# Patient Record
Sex: Male | Born: 1975 | Hispanic: No | Marital: Single
Health system: Southern US, Community
[De-identification: ages and names within clinical notes are randomized; demographics above are authoritative.]

## PROBLEM LIST (undated history)

## (undated) DIAGNOSIS — E785 Hyperlipidemia, unspecified: Secondary | ICD-10-CM

## (undated) DIAGNOSIS — E781 Pure hyperglyceridemia: Secondary | ICD-10-CM

## (undated) DIAGNOSIS — F17200 Nicotine dependence, unspecified, uncomplicated: Secondary | ICD-10-CM

## (undated) DIAGNOSIS — K8020 Calculus of gallbladder without cholecystitis without obstruction: Secondary | ICD-10-CM

## (undated) HISTORY — DX: Calculus of gallbladder without cholecystitis without obstruction: K80.20

## (undated) HISTORY — PX: SHOULDER SURGERY: SHX246

## (undated) HISTORY — DX: Nicotine dependence, unspecified, uncomplicated: F17.200

## (undated) HISTORY — DX: Pure hyperglyceridemia: E78.1

## (undated) HISTORY — PX: NOSE SURGERY: SHX723

## (undated) HISTORY — DX: Hyperlipidemia, unspecified: E78.5

---

## 2007-12-14 ENCOUNTER — Ambulatory Visit (HOSPITAL_BASED_OUTPATIENT_CLINIC_OR_DEPARTMENT_OTHER): Admission: RE | Admit: 2007-12-14 | Discharge: 2007-12-14 | Payer: Self-pay | Admitting: Otolaryngology

## 2007-12-19 ENCOUNTER — Ambulatory Visit: Payer: Self-pay | Admitting: Internal Medicine

## 2008-03-08 ENCOUNTER — Ambulatory Visit (HOSPITAL_BASED_OUTPATIENT_CLINIC_OR_DEPARTMENT_OTHER): Admission: RE | Admit: 2008-03-08 | Discharge: 2008-03-08 | Payer: Self-pay | Admitting: Orthopedic Surgery

## 2010-06-18 LAB — POCT HEMOGLOBIN-HEMACUE: Hemoglobin: 14 g/dL (ref 13.0–17.0)

## 2010-07-17 NOTE — Procedures (Signed)
Edwin Mcdowell, Edwin Mcdowell                ACCOUNT NO.:  [id number]   MEDICAL RECORD NO.:  [id number]          PATIENT TYPE:  OUT   LOCATION:  SLEEP CENTER                 FACILITY:  [facility]   PHYSICIAN:  Clinton D. Maple Hudson, MD, FCCP, FACPDATE OF BIRTH:  11-10-75   DATE OF STUDY:  12/14/2007                            NOCTURNAL POLYSOMNOGRAM   REFERRING PHYSICIAN:  Suzanna Obey, M.D.   REFERRING PHYSICIAN:  Suzanna Obey, M.D.   INDICATION FOR STUDY:  Hypersomnia with sleep apnea.   EPWORTH SLEEPINESS SCORE:  Epworth sleepiness score 6/24.  BMI 25.  Weight 190 pounds.  Height 73 inches.  Neck 15.5 inches.   MEDICATIONS:  Home Medication:  None listed.   SLEEP ARCHITECTURE:  Split study protocol.  During the diagnostic phase,  total sleep time was 120 minutes with sleep efficiency 93.8%.  Stage I  was 3.3%.  Stage II 72.1%.  Stage III 11.3%.  REM is 13.3% of total  sleep time.  Sleep latency 7 minutes.  REM latency 78.5 minutes.  Wake  after sleep onset of 1 minute.  Arousal index 25.5.  No bedtime  medications were taken.   RESPIRATORY DATA:  Split study protocol.  Apnea-hypopnea index (AHI)  18.5 per hour before CPAP.  37 events were scored including 6  obstructive apneas, 4 central apneas, 1 mixed apnea, and 26 hypopneas.  Events were more common while in supine.  REM AHI 37.5.  CPAP was  titrated to 9 CWP, AHI 3.7 per hour.  It shows a medium [employer] SNAPP with heated humidifier.   OXYGEN DATA:  Mild-to-moderate snoring before CPAP with oxygen  desaturation to a nadir of 87%.  After CPAP control, mean oxygen  saturation held 95.8% on room air.   CARDIAC DATA:  Normal sinus rhythm.   MOVEMENT-PARASOMNIA:  Occasional limb jerk with no sleep disturbance  associated.  No bathroom trips.   IMPRESSIONS-RECOMMENDATIONS:  1. Moderate obstructive and central sleep apnea, AHI 18.5 per hour.      Most events were while supine but not exclusively.  Mild-to-      moderate snoring  with oxygen desaturation to a nadir of 87%.  The      few central apneas noted are unlikely to be of medical      significance.  2. Successful CPAP titration to AHI 9.2 per hour.  He may tolerate      another pressure step up to 10 CWP to compensate for breakthrough      events.  It shows a [employer].      Clinton D. Maple Hudson, MD, [facility], FACP  Diplomate, Biomedical engineer of Sleep Medicine  Electronically Signed     CDY/MEDQ  D:  12/19/2007 15:57:52  T:  12/20/2007 01:34:39  Job:  [id number]

## 2010-07-17 NOTE — Op Note (Signed)
Edwin Mcdowell, Edwin Mcdowell                ACCOUNT NO.:  [id number]   MEDICAL RECORD NO.:  [id number]          PATIENT TYPE:  AMB   LOCATION:  [facility]                         FACILITY:  [facility]   PHYSICIAN:  Marlowe Kays, M.D.  DATE OF BIRTH:  1976-01-24   DATE OF PROCEDURE:  03/08/2008  DATE OF DISCHARGE:                               OPERATIVE REPORT   PREOPERATIVE DIAGNOSES:  1. Chronic impingement syndrome.  2. Labral tear, right shoulder.   POSTOPERATIVE DIAGNOSES:  1. Chronic impingement syndrome.  2. Labral tear, right shoulder.   OPERATION:  1. Right shoulder arthroscopy with debridement of labrum and some      fibrous bands in the glenohumeral joint.  2. Arthroscopic subacromial decompression.   SURGEON:  Marlowe Kays, M.D.   ASSISTANTDruscilla Brownie. Cherlynn June.   ANESTHESIA:  General.   PATHOLOGY AND INDICATION FOR PROCEDURE:  I had followed this man for an  extended period of time because of shoulder problems.  He has had two  MRIs with the most recent being on January 19, 2008 showing the labral  tear with a 5 mm para-labral cyst and the type III acromion with  features of the impingement.  Because of his chronic disabilities he is  here today for the above-mentioned surgery.   PROCEDURE:  After satisfactory anesthesia, beach-chair position on the  sliding frame, right shoulder girdle was prepped with DuraPrep and  draped in sterile field.  Anatomy of the shoulder joint was marked out.  A time-out performed.  The posterior and lateral portals and subacromial  space were infiltrated with 0.5% Marcaine with adrenaline and through a  posterior soft spot portal I atraumatically entered the glenohumeral  joint.  His biceps tendon and rotator cuff looked normal, as did his  humeral head and glenoid other than some mild degeneration of the labrum  both anteriorly and posteriorly and around the biceps anchor.  He also  had a fibrous band which covered the biceps tendon  went down to the  glenohumeral joint.  I advanced the scope between the biceps tendon and  subscapularis and using a switching stick and an anterior incision  placed a metal cannula into the shoulder joint, followed by 4.2 shaver.  I debrided down the labrum and also removed the fibrous band.  Final  pictures were taken.  I then redirected the scope in the subacromial  space through a lateral portal and introduced a 4.2 shaver.  He had a  significant amount of bursal tissue which I pictured and resected with  the 4.2 shaver.  He had also a significant impingement problem.  After  removing soft tissue from the undersurface of the acromion with the 9  degrees ArthroCare vaporizer, I brought in the 4-mm oval bur and began  burring down and decompressing the subacromial space.  I went back-and-  forth between the bur and the vaporizer until we had a wide  decompression.  I  documented with pictures of his arm to side and his  arm abducted at.  There was no bleeding at closure.  I removed all fluid  possible from subacromial space and infiltrated the three portals and  subacromial space with 0.5% Marcaine with  adrenaline.  The three portals were closed with 4-0 nylon.  Betadine,  Adaptic pressure dressing and shoulder immobilizer applied.  He  tolerated the procedure well.  At the time of this dictation he was on  his way to recovery in satisfactory condition with no known  complications.           ______________________________  Marlowe Kays, M.D.     JA/MEDQ  D:  03/08/2008  T:  03/08/2008  Job:  [id number]

## 2013-04-29 ENCOUNTER — Other Ambulatory Visit: Payer: Self-pay | Admitting: Family

## 2013-04-29 DIAGNOSIS — Q638 Other specified congenital malformations of kidney: Secondary | ICD-10-CM

## 2013-04-29 DIAGNOSIS — R109 Unspecified abdominal pain: Secondary | ICD-10-CM

## 2013-04-30 ENCOUNTER — Ambulatory Visit
Admission: RE | Admit: 2013-04-30 | Discharge: 2013-04-30 | Disposition: A | Discharge status: Home / Self Care | Payer: Commercial Managed Care - PPO | Source: Ambulatory Visit | Attending: Family | Admitting: Family

## 2013-04-30 DIAGNOSIS — R109 Unspecified abdominal pain: Secondary | ICD-10-CM

## 2013-04-30 DIAGNOSIS — Q638 Other specified congenital malformations of kidney: Secondary | ICD-10-CM

## 2013-05-05 ENCOUNTER — Telehealth (INDEPENDENT_AMBULATORY_CARE_PROVIDER_SITE_OTHER): Payer: Self-pay

## 2013-05-05 ENCOUNTER — Ambulatory Visit (INDEPENDENT_AMBULATORY_CARE_PROVIDER_SITE_OTHER): Payer: Commercial Managed Care - PPO | Admitting: General Surgery

## 2013-05-05 ENCOUNTER — Encounter (INDEPENDENT_AMBULATORY_CARE_PROVIDER_SITE_OTHER): Payer: Self-pay | Admitting: General Surgery

## 2013-05-05 VITALS — BP 136/84 | HR 71 | Temp 98.8°F | Resp 16 | Ht 74.0 in | Wt 222.5 lb

## 2013-05-05 DIAGNOSIS — M549 Dorsalgia, unspecified: Secondary | ICD-10-CM

## 2013-05-05 NOTE — Telephone Encounter (Signed)
Office notes for today's visit faxed to Dr. Debroah BallerFulp's office @ [phone], fax confirmation rec'd

## 2013-05-05 NOTE — Progress Notes (Signed)
Patient ID: Edwin Mcdowell, male   DOB: 07/03/1975, 38 y.o.   MRN: [id number]  No chief complaint on file.   HPI Edwin Mcdowell is a 38 y.o. male.  The patient is a 38 year old male who is referred by Dr. Jillyn HiddenFulp for evaluation of right lower back pain. Patient said this pain occurred this past weekend. He states a previous episode in the past. He states that he did not have any nausea or vomiting with the pain. Patient underwent a renal ultrasound which revealed sludge within the gallbladder however there was no stones. There is no signs of gallbladder wall thickening.  HPI  Past Medical History  Diagnosis Date  . Calculus of gallbladder without mention of cholecystitis or obstruction   . Hyperlipidemia   . Hypertriglyceridemia   . Tobacco use disorder     No past surgical history on file.  No family history on file.  Social History History  Substance Use Topics  . Smoking status: Not on file  . Smokeless tobacco: Not on file  . Alcohol Use: No    Allergies not on file  Current Outpatient Prescriptions  Medication Sig Dispense Refill  . tiZANidine (ZANAFLEX) 4 MG tablet       . traMADol (ULTRAM) 50 MG tablet        No current facility-administered medications for this visit.    Review of Systems Review of Systems  Constitutional: Negative.   HENT: Negative.   Eyes: Negative.   Respiratory: Negative.   Cardiovascular: Negative.   Gastrointestinal: Negative.   Endocrine: Negative.   Musculoskeletal: Positive for back pain.  Neurological: Negative.     Blood pressure 136/84, pulse 71, temperature 98.8 F (37.1 C), temperature source Temporal, resp. rate 16, height 6\' 2"  (1.88 m), weight 222 lb 8 oz (100.925 kg).  Physical Exam Physical Exam  Constitutional: He is oriented to person, place, and time. He appears well-developed and well-nourished.  HENT:  Head: Normocephalic and atraumatic.  Eyes: Conjunctivae and EOM are normal. Pupils are equal, round, and reactive to  light.  Neck: Normal range of motion. Neck supple.  Cardiovascular: Normal rate, regular rhythm and normal heart sounds.   Pulmonary/Chest: Effort normal and breath sounds normal.  Abdominal: Soft. Bowel sounds are normal. He exhibits no distension and no mass. There is no tenderness. There is no rebound and no guarding.  Musculoskeletal: Normal range of motion.  Neurological: He is alert and oriented to person, place, and time.  Skin: Skin is warm and dry.    Data Reviewed Ultrasound revealed sludge in the gallbladder  Assessment    38 year old male with right lower back pain likely not related to his gallbladder. I did discuss with the patient that should he began having right upper quadrant pain that is associated with right upper back pain this could potentially be his gallbladder. In fact the case would be happy to see him again and discuss surgery at that time. The patient was okay with this plan     Plan    1. Patient follow up as needed        Marigene EhlersRamirez Jr., Jed LimerickArmando 05/05/2013, 10:26 AM

## 2015-07-10 IMAGING — US US RENAL
1 series · 14 of 25 positions shown · non-contrast
Comparison: None.

CLINICAL DATA: Right flank pain.  Right abdominal pain.

EXAM:
RENAL/URINARY TRACT ULTRASOUND COMPLETE

[Series 1: us renal · 0.29mm/px · 46 acquisitions, 14 frames shown]
[im 1/46]
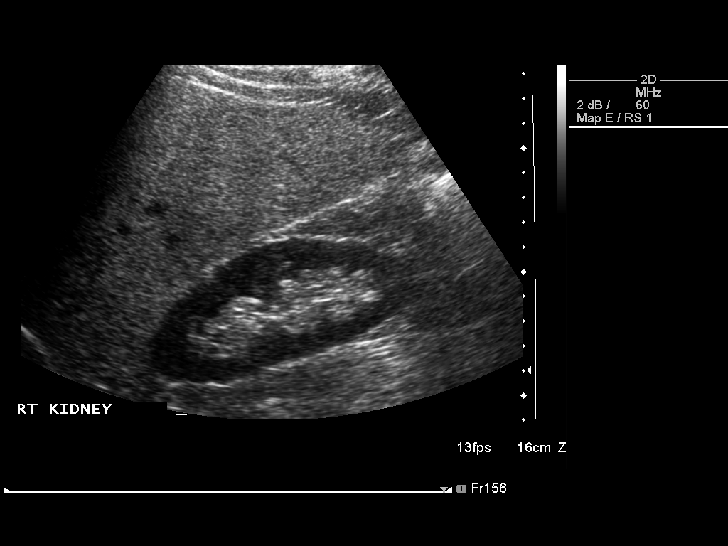
[im 4/46]
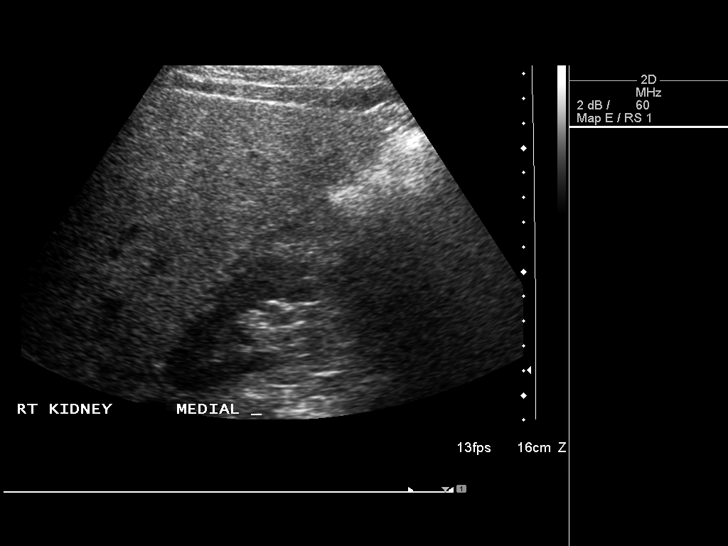
[im 8/46]
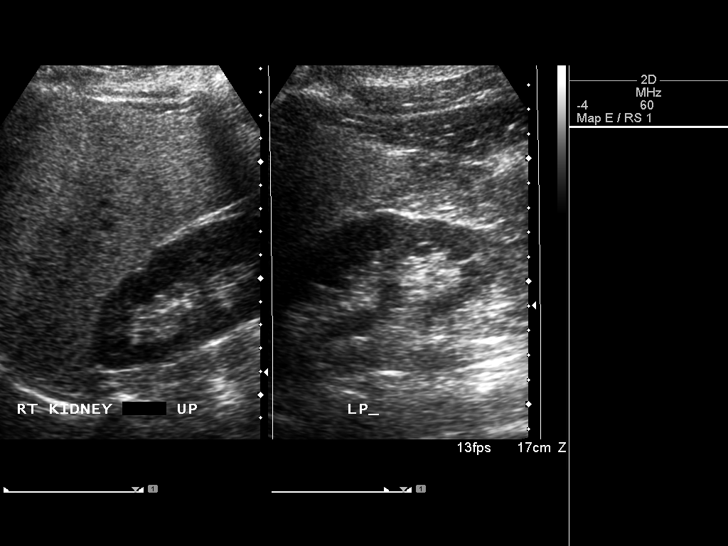
[im 12/46]
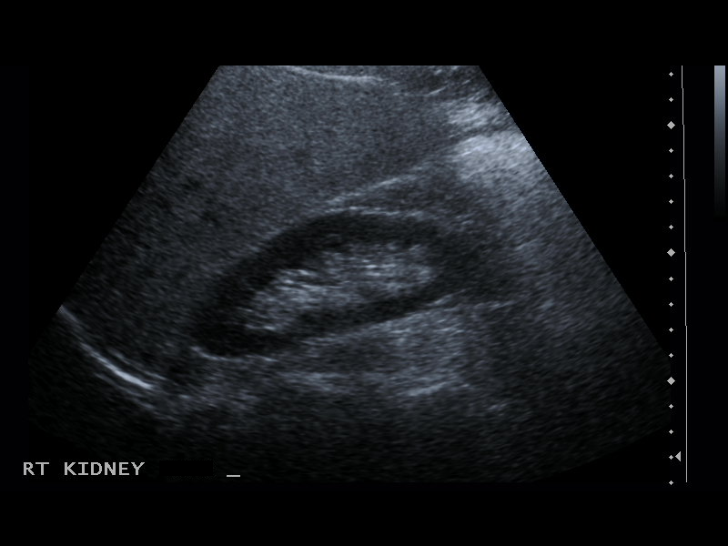
[im 16/46]
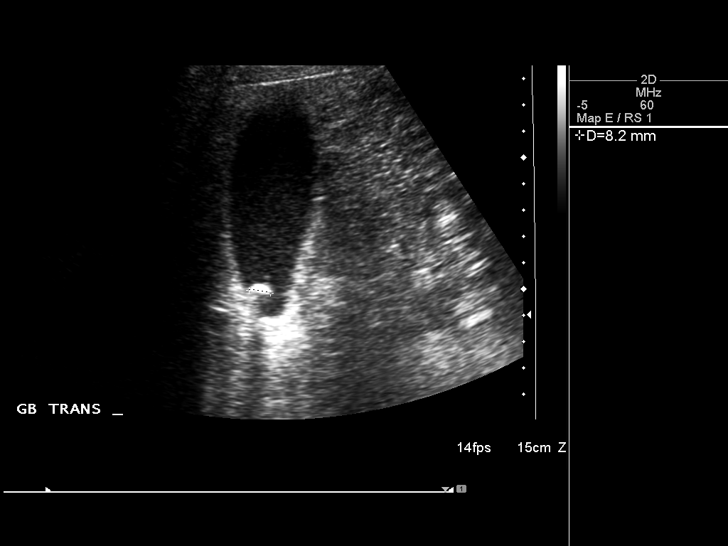
[im 17/46]
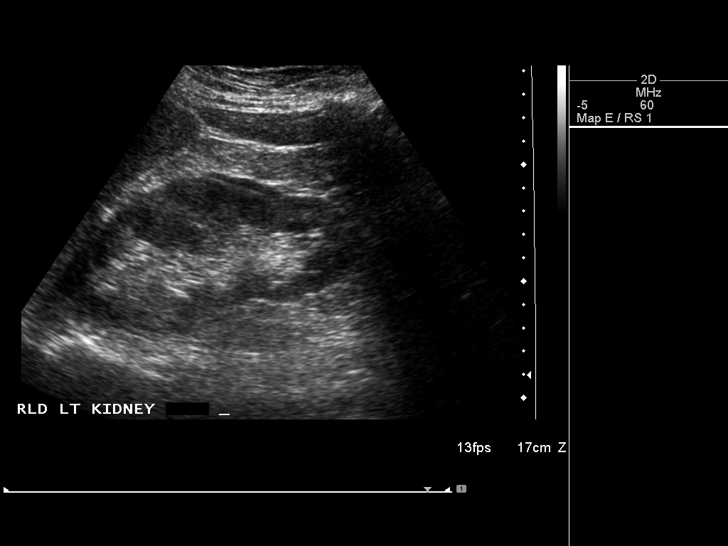
[im 21/46]
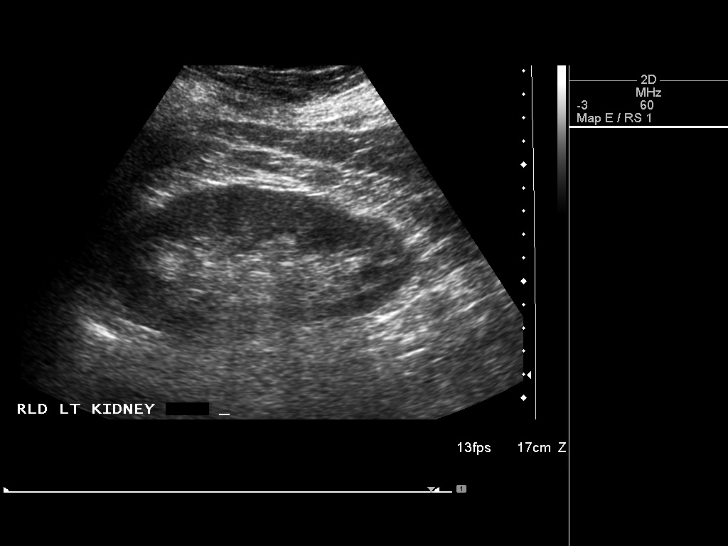
[im 25/46]
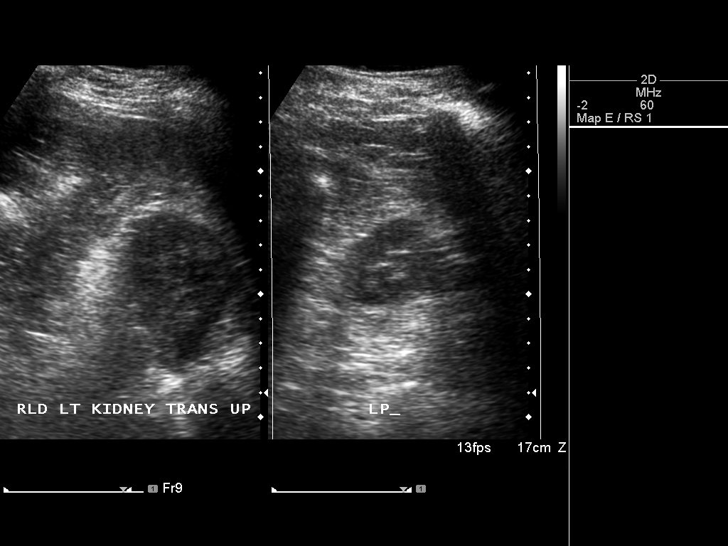
[im 29/46]
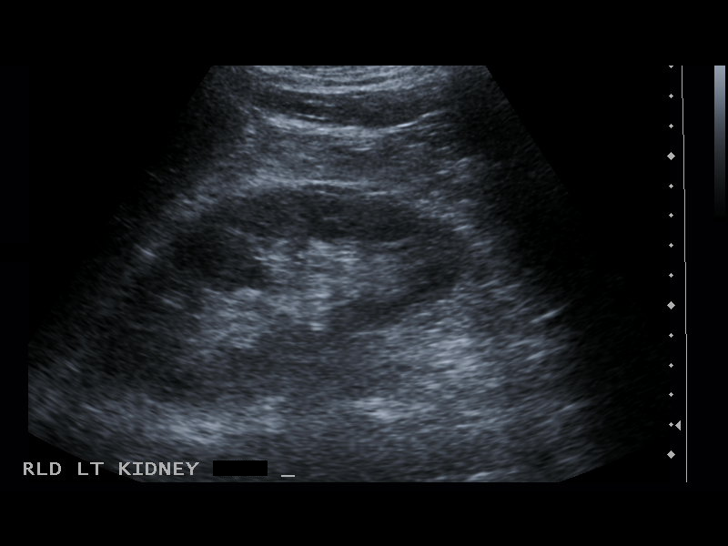
[im 31/46]
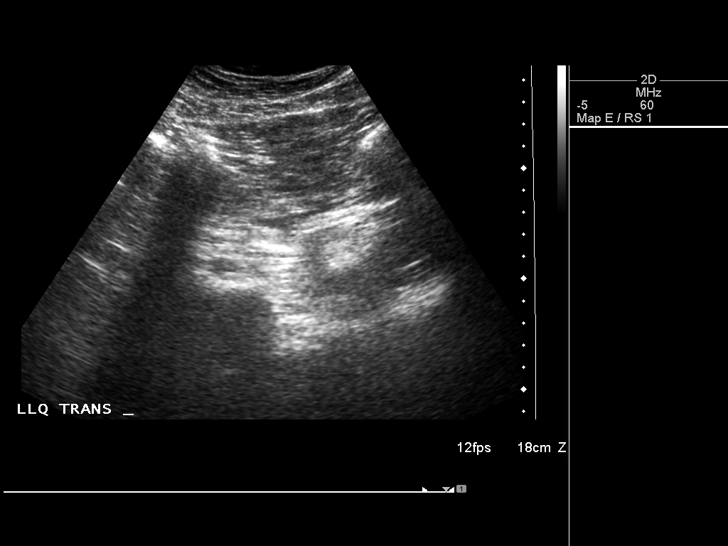
[im 34/46]
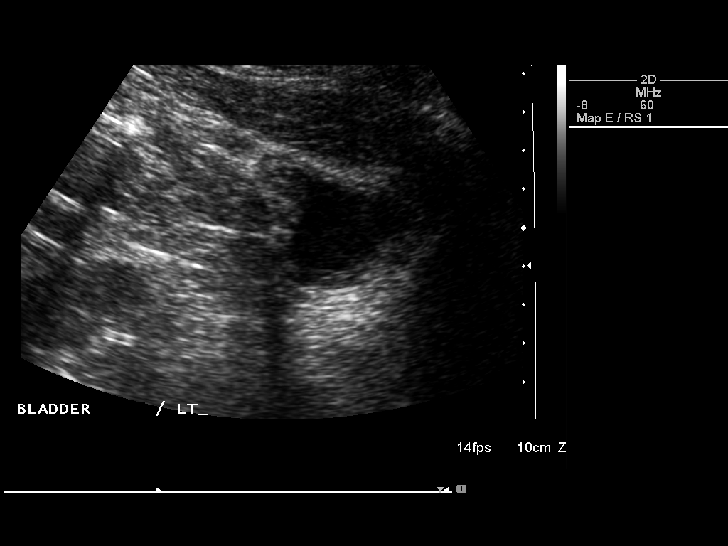
[im 38/46]
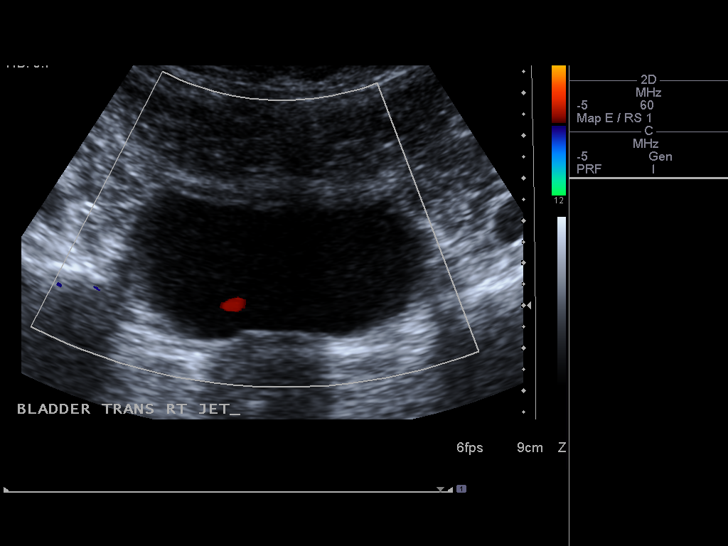
[im 42/46]
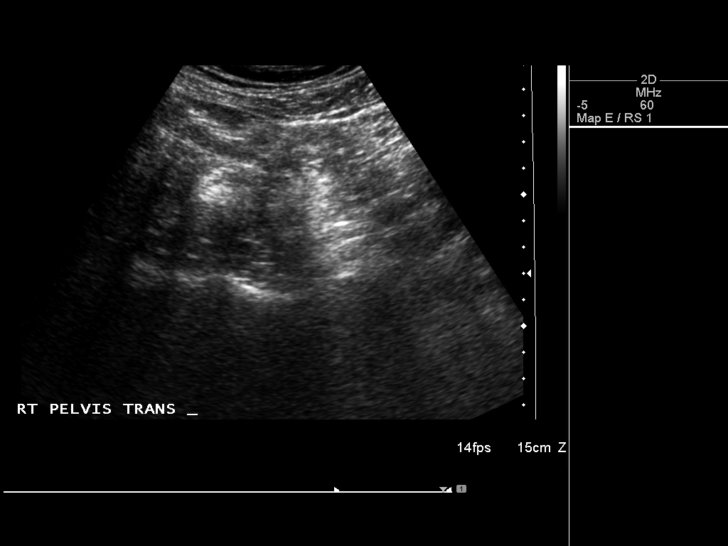
[im 46/46]
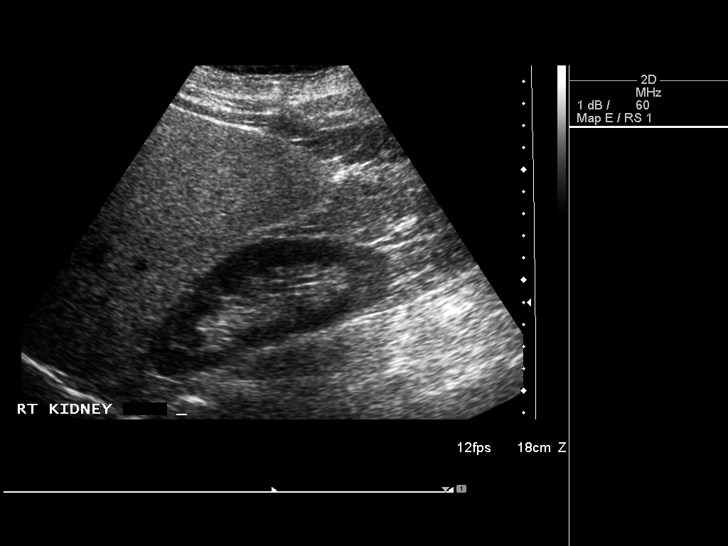

[14 of 25 positions shown; findings below may reference images not displayed]

FINDINGS: Right Kidney:

Length: 12.6 cm. Echogenicity within normal limits. No mass or
hydronephrosis visualized.

Left Kidney:

Length: 13.3 cm. Echogenicity within normal limits. No mass or
hydronephrosis visualized.

Bladder:

Appears normal for degree of bladder distention.

The liver is increased in echogenicity. Incidentally identified is a
gallstone with associated sludge within the gallbladder lumen.
IMPRESSION: No hydronephrosis.

Hepatic steatosis.

Cholelithiasis.

## 2016-03-14 DIAGNOSIS — E782 Mixed hyperlipidemia: Secondary | ICD-10-CM | POA: Diagnosis not present

## 2016-03-14 DIAGNOSIS — E781 Pure hyperglyceridemia: Secondary | ICD-10-CM | POA: Diagnosis not present

## 2016-03-14 DIAGNOSIS — Z0000 Encounter for general adult medical examination without abnormal findings: Secondary | ICD-10-CM | POA: Diagnosis not present

## 2016-09-02 DIAGNOSIS — M7711 Lateral epicondylitis, right elbow: Secondary | ICD-10-CM | POA: Diagnosis not present

## 2017-04-30 DIAGNOSIS — Z136 Encounter for screening for cardiovascular disorders: Secondary | ICD-10-CM | POA: Diagnosis not present

## 2017-04-30 DIAGNOSIS — Z0000 Encounter for general adult medical examination without abnormal findings: Secondary | ICD-10-CM | POA: Diagnosis not present

## 2017-04-30 DIAGNOSIS — E782 Mixed hyperlipidemia: Secondary | ICD-10-CM | POA: Diagnosis not present

## 2017-04-30 DIAGNOSIS — R7301 Impaired fasting glucose: Secondary | ICD-10-CM | POA: Diagnosis not present

## 2017-08-08 DIAGNOSIS — M25511 Pain in right shoulder: Secondary | ICD-10-CM | POA: Diagnosis not present

## 2017-08-21 DIAGNOSIS — M25511 Pain in right shoulder: Secondary | ICD-10-CM | POA: Diagnosis not present

## 2017-09-02 DIAGNOSIS — M25511 Pain in right shoulder: Secondary | ICD-10-CM | POA: Diagnosis not present
